# Patient Record
Sex: Female | Born: 1995 | Race: Black or African American | Hispanic: No | Marital: Single | Smoking: Never smoker
Health system: Southern US, Community
[De-identification: ages and names within clinical notes are randomized; demographics above are authoritative.]

## PROBLEM LIST (undated history)

## (undated) DIAGNOSIS — N390 Urinary tract infection, site not specified: Secondary | ICD-10-CM

---

## 2016-01-30 ENCOUNTER — Emergency Department (HOSPITAL_COMMUNITY)
Admission: EM | Admit: 2016-01-30 | Discharge: 2016-01-30 | Discharge status: Absent without leave | Payer: Self-pay | Source: Home / Self Care

## 2016-01-30 ENCOUNTER — Ambulatory Visit: Payer: Self-pay

## 2018-01-26 ENCOUNTER — Ambulatory Visit
Admission: EM | Admit: 2018-01-26 | Discharge: 2018-01-26 | Disposition: A | Discharge status: Home / Self Care | Payer: No Typology Code available for payment source | Attending: Family Medicine | Admitting: Family Medicine

## 2018-01-26 ENCOUNTER — Encounter: Payer: Self-pay | Admitting: Emergency Medicine

## 2018-01-26 ENCOUNTER — Other Ambulatory Visit: Payer: Self-pay

## 2018-01-26 DIAGNOSIS — R509 Fever, unspecified: Secondary | ICD-10-CM

## 2018-01-26 DIAGNOSIS — R69 Illness, unspecified: Secondary | ICD-10-CM | POA: Diagnosis not present

## 2018-01-26 DIAGNOSIS — R5383 Other fatigue: Secondary | ICD-10-CM | POA: Diagnosis not present

## 2018-01-26 DIAGNOSIS — J111 Influenza due to unidentified influenza virus with other respiratory manifestations: Secondary | ICD-10-CM

## 2018-01-26 DIAGNOSIS — J029 Acute pharyngitis, unspecified: Secondary | ICD-10-CM

## 2018-01-26 LAB — RAPID STREP SCREEN (MED CTR MEBANE ONLY): Streptococcus, Group A Screen (Direct): NEGATIVE

## 2018-01-26 MED ORDER — HYDROCOD POLST-CPM POLST ER 10-8 MG/5ML PO SUER: 5.0000 mL | Freq: Two times a day (BID) | ORAL | 0 refills | Status: DC | PRN

## 2018-01-26 NOTE — ED Provider Notes (Signed)
[facility]    CSN: [id number] Arrival date & time: 01/26/18  1623   History   Chief Complaint Chief Complaint  Patient presents with  . Cough  . Sore Throat  . Sinus Problem   HPI  22 year old female presents with the above complaints.  Patient states that she is been sick since Friday.  She states that she is concerned she has influenza.  She has had fever, chills, congestion, cough, weakness, sore throat, fatigue.  Last fever was on Sunday.  Her predominant complaint currently is cough.  Pain is reportedly 5/10 in severity.  No known exacerbating relieving factors.  No reported sick contacts.  No other associated symptoms.  No other complaints.  PMH - No medical problems.  Surgical Hx - No past surgeries.  OB History    No data available     Home Medications    Prior to Admission medications   Medication Sig Start Date End Date Taking? Authorizing Provider  chlorpheniramine-HYDROcodone (TUSSIONEX PENNKINETIC ER) 10-8 MG/5ML SUER Take 5 mLs by mouth every 12 (twelve) hours as needed. 01/26/18   Tommie Samsook, Maris Bena G, DO    Family History Refractive error Father    Refractive error Mother    Refractive error Sister     Social History Social History   Tobacco Use  . Smoking status: Never Smoker  . Smokeless tobacco: Never Used  Substance Use Topics  . Alcohol use: No    Frequency: Never  . Drug use: Not on file     Allergies   Patient has no known allergies.   Review of Systems Review of Systems  Constitutional: Positive for fatigue and fever.  HENT: Positive for congestion and sore throat.   Respiratory: Positive for cough.    Physical Exam Triage Vital Signs ED Triage Vitals  Enc Vitals Group     BP 01/26/18 1707 126/74     Pulse Rate 01/26/18 1707 90     Resp 01/26/18 1707 16     Temp 01/26/18 1707 98.3 F (36.8 C)     Temp Source 01/26/18 1707 Oral     SpO2 01/26/18 1707 100 %     Weight 01/26/18 1703 150 lb (68 kg)     Height  01/26/18 1703 5\' 6"  (1.676 m)     Head Circumference --      Peak Flow --      Pain Score 01/26/18 1703 5     Pain Loc --      Pain Edu? --      Excl. in GC? --    Updated Vital Signs BP 126/74 (BP Location: Right Arm)   Pulse 90   Temp 98.3 F (36.8 C) (Oral)   Resp 16   Ht 5\' 6"  (1.676 m)   Wt 150 lb (68 kg)   LMP 01/06/2018 (Exact Date)   SpO2 100%   BMI 24.21 kg/m   Physical Exam  Constitutional: She is oriented to person, place, and time. She appears well-developed. No distress.  HENT:  Mouth/Throat: Oropharynx is clear and moist.  Eyes: Conjunctivae are normal. Right eye exhibits no discharge. Left eye exhibits no discharge.  Cardiovascular: Normal rate and regular rhythm.  Pulmonary/Chest: Effort normal and breath sounds normal. She has no wheezes. She has no rales.  Neurological: She is alert and oriented to person, place, and time.  Psychiatric: She has a normal mood and affect. Her behavior is normal.  Nursing note and vitals reviewed.  UC Treatments / Results  Labs (all labs ordered are listed, but only abnormal results are displayed) Labs Reviewed  RAPID STREP SCREEN (NOT AT [facility])  CULTURE, GROUP A STREP [facility])    EKG  EKG Interpretation None       Radiology No results found.  Procedures Procedures (including critical care time)  Medications Ordered in UC Medications - No data to display   Initial Impression / Assessment and Plan / UC Course  I have reviewed the triage vital signs and the nursing notes.  Pertinent labs & imaging results that were available during my care of the patient were reviewed by me and considered in my medical decision making (see chart for details).     21 year old female presents with influenza-like illness.  Given the duration of her illness, influenza testing was not done.  No treatment regarding influenza as it will not be beneficial.  Tussionex for cough.  Supportive care.  Final Clinical Impressions(s) / UC  Diagnoses   Final diagnoses:  Influenza-like illness    ED Discharge Orders        Ordered    chlorpheniramine-HYDROcodone (TUSSIONEX PENNKINETIC ER) 10-8 MG/5ML SUER  Every 12 hours PRN     01/26/18 1736     Controlled Substance Prescriptions [place] Controlled Substance Registry consulted? Not Applicable   Tommie Sams, DO 01/26/18 1758

## 2018-01-26 NOTE — Discharge Instructions (Signed)
This is viral. Likely from recent influenza. No antibiotics needed at this time.  Rest, fluids.  Use the cough medication as prescribed.  I hope you feel better.  Take care  Dr. Adriana Simasook

## 2018-01-26 NOTE — ED Triage Notes (Signed)
Patient c/o sore throat, cough, congestion, and sinus pressure that started on Friday.

## 2018-01-29 LAB — CULTURE, GROUP A STREP (THRC)

## 2018-09-30 ENCOUNTER — Other Ambulatory Visit: Payer: Self-pay

## 2018-09-30 ENCOUNTER — Ambulatory Visit
Admission: EM | Admit: 2018-09-30 | Discharge: 2018-09-30 | Disposition: A | Discharge status: Home / Self Care | Payer: No Typology Code available for payment source | Attending: Family Medicine | Admitting: Family Medicine

## 2018-09-30 ENCOUNTER — Encounter: Payer: Self-pay | Admitting: Emergency Medicine

## 2018-09-30 DIAGNOSIS — R300 Dysuria: Secondary | ICD-10-CM

## 2018-09-30 LAB — URINALYSIS, COMPLETE (UACMP) WITH MICROSCOPIC
BILIRUBIN URINE: NEGATIVE
Glucose, UA: NEGATIVE mg/dL
NITRITE: NEGATIVE
SPECIFIC GRAVITY, URINE: 1.01 (ref 1.005–1.030)
pH: 7 (ref 5.0–8.0)

## 2018-09-30 MED ORDER — PHENAZOPYRIDINE HCL 100 MG PO TABS: 100.0000 mg | ORAL_TABLET | Freq: Three times a day (TID) | ORAL | 0 refills | Status: AC | PRN

## 2018-09-30 MED ORDER — CEPHALEXIN 500 MG PO CAPS: 500.0000 mg | ORAL_CAPSULE | Freq: Two times a day (BID) | ORAL | 0 refills | Status: AC

## 2018-09-30 NOTE — ED Triage Notes (Signed)
Patient was seen at another Urgent Care on 10/25 and treated for BV. She also c/o dysuria and frequency at that time. Patient stated at that time she did not have a UTI. On 10/29 she continued to have dysuria. Macrobid was sent in for the patient. Patient states her urine is still cloudy, she still has dysuria and now incontinence.

## 2018-09-30 NOTE — ED Provider Notes (Signed)
[facility]    CSN: [id number] Arrival date & time: 09/30/18  1810  History   Chief Complaint Chief Complaint  Patient presents with  . Dysuria  . Urinary Incontinence   HPI  22 year old female presents with urinary symptoms.  Patient has recently been seen by [facility] urgent [facility].  Patient was seen on 10/25.  Complained of vaginal discharge and odor and also dysuria.  Wet prep with BV.  Urinalysis not consistent with UTI.  Urine culture returned negative.  STD testing negative.  She was treated with Flagyl.  Patient reports that she has continued to have dysuria.  She called the office on 10/29 and was prescribed Macrobid.  She states that she has not tolerated it due to nausea and headache.  She reports continued dysuria.  Reports chills and urinary incontinence as well.  States that her urine is cloudy.  No known exacerbating relieving factors.  No other associated symptoms.  No other complaints.  Social hx reviewed as below. Social History Social History   Tobacco Use  . Smoking status: Never Smoker  . Smokeless tobacco: Never Used  Substance Use Topics  . Alcohol use: Yes    Frequency: Never    Comment: socially  . Drug use: Never    Allergies   Patient has no known allergies.  Review of Systems Review of Systems  Constitutional: Positive for chills.  Genitourinary: Positive for dysuria.   Physical Exam Triage Vital Signs ED Triage Vitals  Enc Vitals Group     BP 09/30/18 1824 113/84     Pulse Rate 09/30/18 1824 (!) 113     Resp 09/30/18 1824 18     Temp 09/30/18 1824 98 F (36.7 C)     Temp Source 09/30/18 1824 Oral     SpO2 09/30/18 1824 100 %     Weight 09/30/18 1825 153 lb (69.4 kg)     Height 09/30/18 1825 5\' 7"  (1.702 m)     Head Circumference --      Peak Flow --      Pain Score 09/30/18 1825 10     Pain Loc --      Pain Edu? --      Excl. in GC? --    Updated Vital Signs BP 113/84 (BP Location: Left Arm)   Pulse (!) 113   Temp 98  F (36.7 C) (Oral)   Resp 18   Ht 5\' 7"  (1.702 m)   Wt 69.4 kg   LMP 09/15/2018   SpO2 100%   BMI 23.96 kg/m   Visual Acuity Right Eye Distance:   Left Eye Distance:   Bilateral Distance:    Right Eye Near:   Left Eye Near:    Bilateral Near:     Physical Exam  Constitutional: She is oriented to person, place, and time. She appears well-developed. No distress.  Cardiovascular: Normal rate and regular rhythm.  Pulmonary/Chest: Effort normal and breath sounds normal.  Abdominal: Soft. She exhibits no distension. There is no tenderness.  Neurological: She is alert and oriented to person, place, and time.  Psychiatric: She has a normal mood and affect. Her behavior is normal.  Nursing note and vitals reviewed.  UC Treatments / Results  Labs (all labs ordered are listed, but only abnormal results are displayed) Labs Reviewed  URINALYSIS, COMPLETE (UACMP) WITH MICROSCOPIC - Abnormal; Notable for the following components:      Result Value   Hgb urine dipstick SMALL (*)    Ketones,  ur TRACE (*)    Protein, ur TRACE (*)    Leukocytes, UA TRACE (*)    Bacteria, UA FEW (*)    All other components within normal limits  URINE CULTURE    EKG None  Radiology No results found.  Procedures Procedures (including critical care time)  Medications Ordered in UC Medications - No data to display  Initial Impression / Assessment and Plan / UC Course  I have reviewed the triage vital signs and the nursing notes.  Pertinent labs & imaging results that were available during my care of the patient were reviewed by me and considered in my medical decision making (see chart for details).    22 year old female presents with dysuria.  Urine microscopy with red cells as well as white cells.  Sending culture.  Placing empirically on Keflex.  Stop Macrobid.  Final Clinical Impressions(s) / UC Diagnoses   Final diagnoses:  Dysuria     Discharge Instructions     Stop  Macrobid.  Start Keflex.  We will culture your urine.  Take care  Dr. Adriana Simas     ED Prescriptions    Medication Sig Dispense Auth. Provider   cephALEXin (KEFLEX) 500 MG capsule Take 1 capsule (500 mg total) by mouth 2 (two) times daily. 14 capsule Welles Walthall G, DO   phenazopyridine (PYRIDIUM) 100 MG tablet Take 1 tablet (100 mg total) by mouth 3 (three) times daily as needed for pain. 10 tablet Tommie Sams, DO     Controlled Substance Prescriptions [place] Controlled Substance Registry consulted? Not Applicable   Tommie Sams, DO 09/30/18 1610

## 2018-09-30 NOTE — Discharge Instructions (Signed)
Stop Macrobid.  Start Keflex.  We will culture your urine.  Take care  Dr. Adriana Simas

## 2018-10-02 LAB — URINE CULTURE

## 2018-10-04 ENCOUNTER — Telehealth (HOSPITAL_COMMUNITY): Payer: Self-pay

## 2018-10-04 NOTE — Telephone Encounter (Signed)
Urine culture positive for group a strep. Pt was treated with Keflex per Dr. Delton See.  Attempted to reach patient no answer at this time.

## 2020-04-16 ENCOUNTER — Emergency Department
Admission: EM | Admit: 2020-04-16 | Discharge: 2020-04-16 | Disposition: A | Discharge status: Home / Self Care | Payer: Medicaid Other | Attending: Emergency Medicine | Admitting: Emergency Medicine

## 2020-04-16 ENCOUNTER — Emergency Department: Payer: Medicaid Other

## 2020-04-16 ENCOUNTER — Other Ambulatory Visit: Payer: Self-pay

## 2020-04-16 DIAGNOSIS — M940 Chondrocostal junction syndrome [Tietze]: Secondary | ICD-10-CM

## 2020-04-16 DIAGNOSIS — R0789 Other chest pain: Secondary | ICD-10-CM

## 2020-04-16 LAB — CBC
HCT: 40 % (ref 36.0–46.0)
Hemoglobin: 12.8 g/dL (ref 12.0–15.0)
MCH: 23.2 pg — ABNORMAL LOW (ref 26.0–34.0)
MCHC: 32 g/dL (ref 30.0–36.0)
MCV: 72.6 fL — ABNORMAL LOW (ref 80.0–100.0)
Platelets: 302 10*3/uL (ref 150–400)
RBC: 5.51 MIL/uL — ABNORMAL HIGH (ref 3.87–5.11)
RDW: 14.5 % (ref 11.5–15.5)
WBC: 6.4 10*3/uL (ref 4.0–10.5)
nRBC: 0 % (ref 0.0–0.2)

## 2020-04-16 LAB — BASIC METABOLIC PANEL
Anion gap: 12 (ref 5–15)
BUN: 16 mg/dL (ref 6–20)
CO2: 19 mmol/L — ABNORMAL LOW (ref 22–32)
Calcium: 9.2 mg/dL (ref 8.9–10.3)
Chloride: 104 mmol/L (ref 98–111)
Creatinine, Ser: 1.09 mg/dL — ABNORMAL HIGH (ref 0.44–1.00)
GFR calc Af Amer: >60 mL/min (ref >60)
GFR calc non Af Amer: >60 mL/min (ref >60)
Glucose, Bld: 156 mg/dL — ABNORMAL HIGH (ref 70–99)
Potassium: 3.2 mmol/L — ABNORMAL LOW (ref 3.5–5.1)
Sodium: 135 mmol/L (ref 135–145)

## 2020-04-16 LAB — FIBRIN DERIVATIVES D-DIMER (ARMC ONLY): Fibrin derivatives D-dimer (ARMC): 267.08 ng/mL (FEU) (ref 0.00–499.00)

## 2020-04-16 LAB — TROPONIN I (HIGH SENSITIVITY)
Troponin I (High Sensitivity): 2 ng/L (ref <18)
Troponin I (High Sensitivity): 5 ng/L (ref <18)

## 2020-04-16 MED ORDER — OXYCODONE-ACETAMINOPHEN 5-325 MG PO TABS
1.0000 | ORAL_TABLET | Freq: Once | ORAL | Status: DC
  Filled 2020-04-16: qty 1

## 2020-04-16 MED ORDER — ONDANSETRON 4 MG PO TBDP
4.0000 mg | ORAL_TABLET | Freq: Once | ORAL | Status: AC
  Administered 2020-04-16: 4 mg via ORAL

## 2020-04-16 MED ORDER — NAPROXEN 500 MG PO TABS: 500.0000 mg | ORAL_TABLET | Freq: Two times a day (BID) | ORAL | 0 refills | Status: AC

## 2020-04-16 MED ORDER — OXYCODONE-ACETAMINOPHEN 5-325 MG PO TABS: 1.0000 | ORAL_TABLET | ORAL | 0 refills | Status: AC | PRN

## 2020-04-16 MED ORDER — ONDANSETRON 4 MG PO TBDP
ORAL_TABLET | ORAL | Status: AC
  Filled 2020-04-16: qty 1

## 2020-04-16 MED ORDER — KETOROLAC TROMETHAMINE 60 MG/2ML IM SOLN
30.0000 mg | Freq: Once | INTRAMUSCULAR | Status: AC
  Administered 2020-04-16: 30 mg via INTRAMUSCULAR
  Filled 2020-04-16: qty 2

## 2020-04-16 NOTE — ED Triage Notes (Signed)
Patient c/o left chest pain. Patient dx with pleurisy 2 days ago. Patient reports pain becoming more severe.

## 2020-04-16 NOTE — Discharge Instructions (Signed)
1.  Take pain medicines as prescribed (Naprosyn/Percocet #15). 2.  Apply moist heat to affected area several times daily. 3.  Return to the ER for worsening symptoms, persistent vomiting, difficulty breathing or other concerns.

## 2020-04-16 NOTE — ED Provider Notes (Signed)
[facility] Emergency Department Provider Note   ____________________________________________   First MD Initiated Contact with Patient 04/16/20 [phone]     (approximate)  I have reviewed the triage vital signs and the nursing notes.   HISTORY  Chief Complaint Chest Pain    HPI Courtney Macias is a 24 y.o. female who presents to the ED from home with a chief complaint of chest pain.  Patient reports left chest wall pain x1.5 weeks.  This is her third ED visit for this.  On 5/14 she left without being seen from [facility].  She had labs and Covid testing done there which were unremarkable.  The following day she was evaluated at [facility], had negative work-up including negative D-dimer.  Received IM Toradol and discharge.  Reports progressive left chest wall pain exacerbated by breathing and movement.  Denies fever, cough, shortness of breath, abdominal pain, nausea, vomiting, dizziness.  Denies recent travel, trauma or OCP use.       Past medical history None  There are no problems to display for this patient.   History reviewed. No pertinent surgical history.  Prior to Admission medications   Medication Sig Start Date End Date Taking? Authorizing Provider  cephALEXin (KEFLEX) 500 MG capsule Take 1 capsule (500 mg total) by mouth 2 (two) times daily. 09/30/18   Tommie Sams, DO  naproxen (NAPROSYN) 500 MG tablet Take 1 tablet (500 mg total) by mouth 2 (two) times daily with a meal. 04/16/20   Irean Hong, MD  oxyCODONE-acetaminophen (PERCOCET/ROXICET) 5-325 MG tablet Take 1 tablet by mouth every 4 (four) hours as needed for severe pain. 04/16/20   Irean Hong, MD  phenazopyridine (PYRIDIUM) 100 MG tablet Take 1 tablet (100 mg total) by mouth 3 (three) times daily as needed for pain. 09/30/18   Tommie Sams, DO    Allergies Patient has no known allergies.  No family history on file.  Social History Social History   Tobacco Use  . Smoking status: Never  Smoker  . Smokeless tobacco: Never Used  Substance Use Topics  . Alcohol use: Yes    Comment: socially  . Drug use: Never    Review of Systems  Constitutional: No fever/chills Eyes: No visual changes. ENT: No sore throat. Cardiovascular: Positive for left chest wall pain. Respiratory: Denies shortness of breath. Gastrointestinal: No abdominal pain.  No nausea, no vomiting.  No diarrhea.  No constipation. Genitourinary: Negative for dysuria. Musculoskeletal: Negative for back pain. Skin: Negative for rash. Neurological: Negative for headaches, focal weakness or numbness.   ____________________________________________   PHYSICAL EXAM:  VITAL SIGNS: ED Triage Vitals  Enc Vitals Group     BP 04/16/20 0017 (!) 152/81     Pulse Rate 04/16/20 0017 (!) 115     Resp 04/16/20 0017 18     Temp 04/16/20 0017 99.4 F (37.4 C)     Temp Source 04/16/20 0216 Oral     SpO2 04/16/20 0017 100 %     Weight 04/16/20 0015 170 lb (77.1 kg)     Height 04/16/20 0015 5\' 7"  (1.702 m)     Head Circumference --      Peak Flow --      Pain Score 04/16/20 0015 8     Pain Loc --      Pain Edu? --      Excl. in GC? --     Constitutional: Alert and oriented. Well appearing and in no acute distress.  Eyes: Conjunctivae are normal. PERRL. EOMI. Head: Atraumatic. Nose: No congestion/rhinnorhea. Mouth/Throat: Mucous membranes are moist.  Oropharynx non-erythematous. Neck: No stridor.   Cardiovascular: Normal rate, regular rhythm. Grossly normal heart sounds.  Good peripheral circulation. Respiratory: Normal respiratory effort.  No retractions. Lungs CTAB.  Tender to palpation to left anterior chest wall.  Pain on movement. Gastrointestinal: Soft and nontender. No distention. No abdominal bruits. No CVA tenderness. Musculoskeletal: No lower extremity tenderness nor edema.  No joint effusions. Neurologic:  Normal speech and language. No gross focal neurologic deficits are appreciated. No gait  instability. Skin:  Skin is warm, dry and intact. No rash noted. Psychiatric: Mood and affect are normal. Speech and behavior are normal.  ____________________________________________   LABS (all labs ordered are listed, but only abnormal results are displayed)  Labs Reviewed  BASIC METABOLIC PANEL - Abnormal; Notable for the following components:      Result Value   Potassium 3.2 (*)    CO2 19 (*)    Glucose, Bld 156 (*)    Creatinine, Ser 1.09 (*)    All other components within normal limits  CBC - Abnormal; Notable for the following components:   RBC 5.51 (*)    MCV 72.6 (*)    MCH 23.2 (*)    All other components within normal limits  FIBRIN DERIVATIVES D-DIMER ([facility] ONLY)  POC URINE PREG, ED  TROPONIN I (HIGH SENSITIVITY)  TROPONIN I (HIGH SENSITIVITY)   ____________________________________________  EKG  ED ECG REPORT I, Lamica Mccart J, the attending physician, personally viewed and interpreted this ECG.   Date: 04/16/2020  EKG Time: 0008  Rate: 115  Rhythm: sinus tachycardia  Axis: Normal  Intervals:none  ST&T Change: Nonspecific  ____________________________________________  RADIOLOGY  ED MD interpretation: No acute cardiopulmonary process  Official radiology report(s): DG Chest 2 View  Result Date: 04/16/2020 CLINICAL DATA:  Chest pain EXAM: CHEST - 2 VIEW COMPARISON:  None. FINDINGS: The heart size and mediastinal contours are within normal limits. Both lungs are clear. The visualized skeletal structures are unremarkable. IMPRESSION: No active cardiopulmonary disease. Electronically Signed   By: Prudencio Pair M.D.   On: 04/16/2020 00:42    ____________________________________________   PROCEDURES  Procedure(s) performed (including Critical Care):  Procedures   ____________________________________________   INITIAL IMPRESSION / ASSESSMENT AND PLAN / ED COURSE  As part of my medical decision making, I reviewed the following data within the  [place] notes reviewed and incorporated, Labs reviewed, EKG interpreted, Old chart reviewed, Radiograph reviewed and Notes from prior ED visits     Landis Martins was evaluated in Emergency Department on 04/16/2020 for the symptoms described in the history of present illness. She was evaluated in the context of the global COVID-19 pandemic, which necessitated consideration that the patient might be at risk for infection with the SARS-CoV-2 virus that causes COVID-19. Institutional protocols and algorithms that pertain to the evaluation of patients at risk for COVID-19 are in a state of rapid change based on information released by regulatory bodies including the CDC and federal and state organizations. These policies and algorithms were followed during the patient's care in the ED.    24 year old female who presents with left chest wall pain x1.5 weeks. Differential diagnosis includes, but is not limited to, ACS, aortic dissection, pulmonary embolism, cardiac tamponade, pneumothorax, pneumonia, pericarditis, myocarditis, GI-related causes including esophagitis/gastritis, and musculoskeletal chest wall pain.    Laboratory results including 2 sets of troponins and D-dimer are  unremarkable.  Chest x-ray unrevealing.  Left chest wall is tender to palpation.  Will administer IM Toradol, Percocet, encouraged moist heat.  Strict return precautions given.  Patient verbalizes understanding and agrees with plan of care.      ____________________________________________   FINAL CLINICAL IMPRESSION(S) / ED DIAGNOSES  Final diagnoses:  Chest wall pain  Costochondritis     ED Discharge Orders         Ordered    naproxen (NAPROSYN) 500 MG tablet  2 times daily with meals     04/16/20 0403    oxyCODONE-acetaminophen (PERCOCET/ROXICET) 5-325 MG tablet  Every 4 hours PRN     04/16/20 0403           Note:  This document was prepared using Dragon voice recognition software  and may include unintentional dictation errors.   Irean Hong, MD 04/16/20 [phone]

## 2020-04-16 NOTE — ED Notes (Signed)
Pt alert and oriented X 4, stable for discharge. RR even and unlabored, color WNL. Discussed discharge instructions and follow up when appropriate. Instructed to follow up with ER for any life threatening symptoms or concerns that patient or family of patient may have  

## 2020-08-22 ENCOUNTER — Other Ambulatory Visit: Payer: Self-pay

## 2020-08-22 ENCOUNTER — Ambulatory Visit
Admission: EM | Admit: 2020-08-22 | Discharge: 2020-08-22 | Disposition: A | Discharge status: Home / Self Care | Payer: Medicaid Other

## 2020-10-15 ENCOUNTER — Ambulatory Visit
Admission: EM | Admit: 2020-10-15 | Discharge: 2020-10-15 | Disposition: A | Discharge status: Home / Self Care | Payer: HRSA Program | Attending: Family Medicine | Admitting: Family Medicine

## 2020-10-15 ENCOUNTER — Other Ambulatory Visit: Payer: Self-pay

## 2020-10-15 DIAGNOSIS — R051 Acute cough: Secondary | ICD-10-CM | POA: Diagnosis present

## 2020-10-15 DIAGNOSIS — J069 Acute upper respiratory infection, unspecified: Secondary | ICD-10-CM | POA: Diagnosis not present

## 2020-10-15 DIAGNOSIS — Z20822 Contact with and (suspected) exposure to covid-19: Secondary | ICD-10-CM | POA: Insufficient documentation

## 2020-10-15 DIAGNOSIS — N390 Urinary tract infection, site not specified: Secondary | ICD-10-CM | POA: Insufficient documentation

## 2020-10-15 HISTORY — DX: Urinary tract infection, site not specified: N39.0

## 2020-10-15 MED ORDER — PROMETHAZINE-DM 6.25-15 MG/5ML PO SYRP: 5.0000 mL | ORAL_SOLUTION | Freq: Four times a day (QID) | ORAL | 0 refills | Status: AC | PRN

## 2020-10-15 MED ORDER — BENZONATATE 100 MG PO CAPS: 200.0000 mg | ORAL_CAPSULE | Freq: Three times a day (TID) | ORAL | 0 refills | Status: AC

## 2020-10-15 NOTE — Discharge Instructions (Addendum)
Rinse your sinuses twice daily with a [employer] sinus rinse kit and distilled water.  Use the Tessalon Perles during the day for cough.  Take them with a small sip of water.  Use the Promethazine DM at bedtime.  This will help dry up your congestion and help you with your cough.  It will also make you drowsy.  Isolate at home until your Covid swab is back.  If your Covid swab is positive you will have to quarantine for 10 days from the start of your symptoms.  After 10 days you can break quarantine if your symptoms have improved and you have not had a fever for 24 hours.  Return or follow-up with your primary care provider for new or worsening symptoms.

## 2020-10-15 NOTE — ED Provider Notes (Signed)
[facility]    CSN: [id number] Arrival date & time: 10/15/20  1624      History   Chief Complaint Chief Complaint  Patient presents with  . Cough    HPI Courtney Macias is a 24 y.o. female.   24 year old female here for evaluation of a cough.  Patient reports that she had a cough for the past 2 weeks.  This started when she went to [facility] and then reports that typically when she goes to [facility] she develops a sinus infection.  Patient states that she has had some chest tightness and postnasal drip.  She denies shortness of breath or wheezing, fever, sinus pain or pressure, ear pain or pressure, sore throat, or sick contacts.  Patient has not been vaccinated against Covid or the flu.     Past Medical History:  Diagnosis Date  . UTI (urinary tract infection)     Patient Active Problem List   Diagnosis Date Noted  . UTI (urinary tract infection)     History reviewed. No pertinent surgical history.  OB History   No obstetric history on file.      Home Medications    Prior to Admission medications   Medication Sig Start Date End Date Taking? Authorizing Provider  fluticasone (FLONASE) 50 MCG/ACT nasal spray Place 1 spray into both nostrils daily.   Yes [provider]  nitrofurantoin, macrocrystal-monohydrate, (MACROBID) 100 MG capsule Take 100 mg by mouth 2 (two) times daily. Started taking 10/11/20 for UTI   Yes [provider]  benzonatate (TESSALON) 100 MG capsule Take 2 capsules (200 mg total) by mouth every 8 (eight) hours. 10/15/20   Margarette Canada, NP  cephALEXin (KEFLEX) 500 MG capsule Take 1 capsule (500 mg total) by mouth 2 (two) times daily. 09/30/18   Coral Spikes, DO  naproxen (NAPROSYN) 500 MG tablet Take 1 tablet (500 mg total) by mouth 2 (two) times daily with a meal. 04/16/20   Paulette Blanch, MD  oxyCODONE-acetaminophen (PERCOCET/ROXICET) 5-325 MG tablet Take 1 tablet by mouth every 4 (four) hours as needed for severe  pain. 04/16/20   Paulette Blanch, MD  phenazopyridine (PYRIDIUM) 100 MG tablet Take 1 tablet (100 mg total) by mouth 3 (three) times daily as needed for pain. 09/30/18   Coral Spikes, DO  promethazine-dextromethorphan (PROMETHAZINE-DM) 6.25-15 MG/5ML syrup Take 5 mLs by mouth 4 (four) times daily as needed. 10/15/20   Margarette Canada, NP    Family History Family History  Problem Relation Age of Onset  . Healthy Mother   . Healthy Father     Social History Social History   Tobacco Use  . Smoking status: Never Smoker  . Smokeless tobacco: Never Used  Vaping Use  . Vaping Use: Never used  Substance Use Topics  . Alcohol use: Yes    Comment: socially  . Drug use: Never     Allergies   Patient has no known allergies.   Review of Systems Review of Systems  Constitutional: Negative for activity change, appetite change and fever.  HENT: Positive for postnasal drip and rhinorrhea. Negative for congestion, ear pain, sinus pressure, sinus pain, sore throat and tinnitus.   Respiratory: Positive for cough. Negative for shortness of breath and wheezing.   Cardiovascular: Negative for chest pain.  Gastrointestinal: Negative for diarrhea, nausea and vomiting.  Musculoskeletal: Negative for arthralgias and myalgias.  Skin: Negative for rash.  Neurological: Negative for syncope and headaches.  Hematological: Negative.  Psychiatric/Behavioral: Negative.      Physical Exam Triage Vital Signs ED Triage Vitals  Enc Vitals Group     BP 10/15/20 1801 (!) 130/95     Pulse Rate 10/15/20 1758 73     Resp 10/15/20 1758 18     Temp 10/15/20 1758 98.7 F (37.1 C)     Temp Source 10/15/20 1758 Oral     SpO2 10/15/20 1758 100 %     Weight 10/15/20 1754 172 lb (78 kg)     Height 10/15/20 1754 _0  (1.676 m)     Head Circumference --      Peak Flow --      Pain Score 10/15/20 1754 0     Pain Loc --      Pain Edu? --      Excl. in [place]? --    No data found.  Updated Vital Signs BP (!)  130/95   Pulse 73   Temp 98.7 F (37.1 C) (Oral)   Resp 18   Ht _1  (1.676 m)   Wt 172 lb (78 kg)   LMP 10/01/2020   SpO2 100%   BMI 27.76 kg/m   Visual Acuity Right Eye Distance:   Left Eye Distance:   Bilateral Distance:    Right Eye Near:   Left Eye Near:    Bilateral Near:     Physical Exam Vitals and nursing note reviewed.  Constitutional:      General: She is not in acute distress.    Appearance: Normal appearance. She is normal weight.  HENT:     Head: Normocephalic and atraumatic.     Right Ear: Tympanic membrane, ear canal and external ear normal.     Left Ear: Tympanic membrane, ear canal and external ear normal.     Nose: Congestion and rhinorrhea present.     Comments: Nasal mucosa is mildly edematous without erythema.  Scant clear nasal discharge present.    Mouth/Throat:     Mouth: Mucous membranes are moist.     Pharynx: Oropharynx is clear. No oropharyngeal exudate or posterior oropharyngeal erythema.     Comments: Mild clear postnasal drip present in posterior oropharynx.  No erythema or injection.  Patient also has a single solitary stone in the upper pole of the left tonsil.  No tonsillar erythema, edema, injection, or exudate present. Eyes:     General: No scleral icterus.    Extraocular Movements: Extraocular movements intact.     Conjunctiva/sclera: Conjunctivae normal.     Pupils: Pupils are equal, round, and reactive to light.  Cardiovascular:     Rate and Rhythm: Normal rate and regular rhythm.     Pulses: Normal pulses.     Heart sounds: Normal heart sounds. No murmur heard.  No gallop.   Pulmonary:     Effort: Pulmonary effort is normal.     Breath sounds: Normal breath sounds. No wheezing, rhonchi or rales.  Musculoskeletal:        General: No swelling or tenderness. Normal range of motion.     Cervical back: Normal range of motion and neck supple.  Lymphadenopathy:     Cervical: No cervical adenopathy.  Skin:    General: Skin is  warm and dry.     Capillary Refill: Capillary refill takes less than 2 seconds.     Findings: No erythema or rash.  Neurological:     General: No focal deficit present.     Mental Status: She is alert and  oriented to person, place, and time.  Psychiatric:        Mood and Affect: Mood normal.        Behavior: Behavior normal.        Thought Content: Thought content normal.        Judgment: Judgment normal.      UC Treatments / Results  Labs (all labs ordered are listed, but only abnormal results are displayed) Labs Reviewed  SARS CORONAVIRUS 2 (TAT 6-24 HRS)    EKG   Radiology No results found.  Procedures Procedures (including critical care time)  Medications Ordered in UC Medications - No data to display  Initial Impression / Assessment and Plan / UC Course  I have reviewed the triage vital signs and the nursing notes.  Pertinent labs & imaging results that were available during my care of the patient were reviewed by me and considered in my medical decision making (see chart for details).   Patient is here because she has had a cough for the past 2 weeks.  The cough is present during the day but is mostly present at night when she lays down.  Patient reports that she has had some postnasal drip and a little bit of a runny nose.  Patient states that she first developed symptoms when she returned from [facility].  Patient reports that she gets similar symptoms whenever she returns from [place].  She has been using a humidifier at home to help her symptoms without much relief.  Patient states that she is mainly here for cough relief.  Will check Covid swab.  Will DC home with diagnosis of URI and treat with benzonatate and Promethazine DM.   Final Clinical Impressions(s) / UC Diagnoses   Final diagnoses:  Viral URI with cough     Discharge Instructions     Rinse your sinuses twice daily with a [employer] sinus rinse kit and distilled water.  Use the Tessalon Perles  during the day for cough.  Take them with a small sip of water.  Use the Promethazine DM at bedtime.  This will help dry up your congestion and help you with your cough.  It will also make you drowsy.  Isolate at home until your Covid swab is back.  If your Covid swab is positive you will have to quarantine for 10 days from the start of your symptoms.  After 10 days you can break quarantine if your symptoms have improved and you have not had a fever for 24 hours.  Return or follow-up with your primary care provider for new or worsening symptoms.    ED Prescriptions    Medication Sig Dispense Auth. Provider   benzonatate (TESSALON) 100 MG capsule Take 2 capsules (200 mg total) by mouth every 8 (eight) hours. 21 capsule Margarette Canada, NP   promethazine-dextromethorphan (PROMETHAZINE-DM) 6.25-15 MG/5ML syrup Take 5 mLs by mouth 4 (four) times daily as needed. 118 mL Margarette Canada, NP     PDMP not reviewed this encounter.   Margarette Canada, NP 10/15/20 1826

## 2020-10-15 NOTE — ED Triage Notes (Addendum)
Pt presents to Urgent Care with c/o cough x approx 2 weeks. Pt also c/o intermittent "tightness" to L chest x 1 week. Denies sob, nausea, and radiating pain. No known COVID exposure; pt not vaccinated.

## 2020-10-16 LAB — SARS CORONAVIRUS 2 (TAT 6-24 HRS): SARS Coronavirus 2: NEGATIVE

## 2021-07-23 IMAGING — CR DG CHEST 2V
1 series · 2 of 2 positions shown · non-contrast
Comparison: None.

CLINICAL DATA: Chest pain

EXAM:
CHEST - 2 VIEW

[Series 1: dg chest 2 view · 0.14mm/px · 2 of 2 slices shown]
[im 1/2]
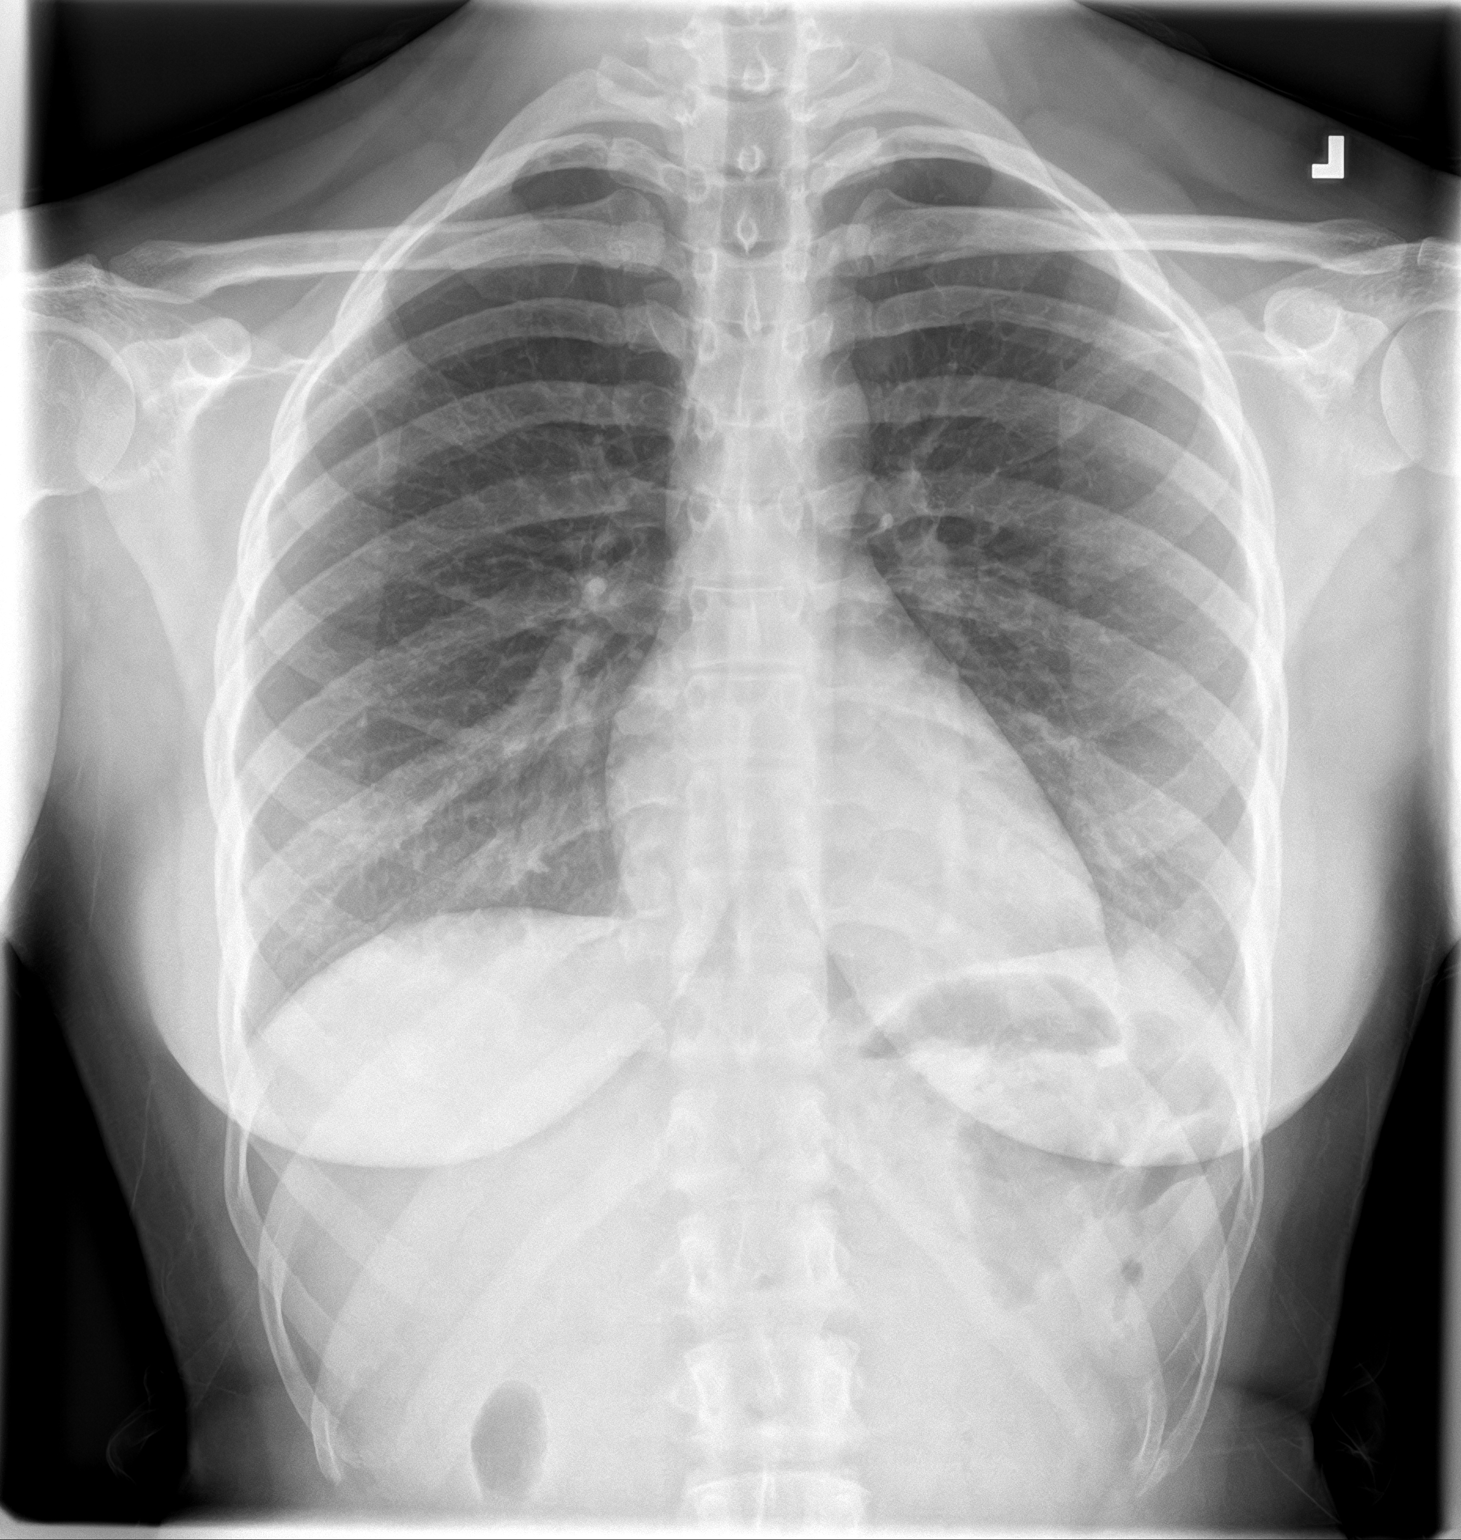
[im 2/2]
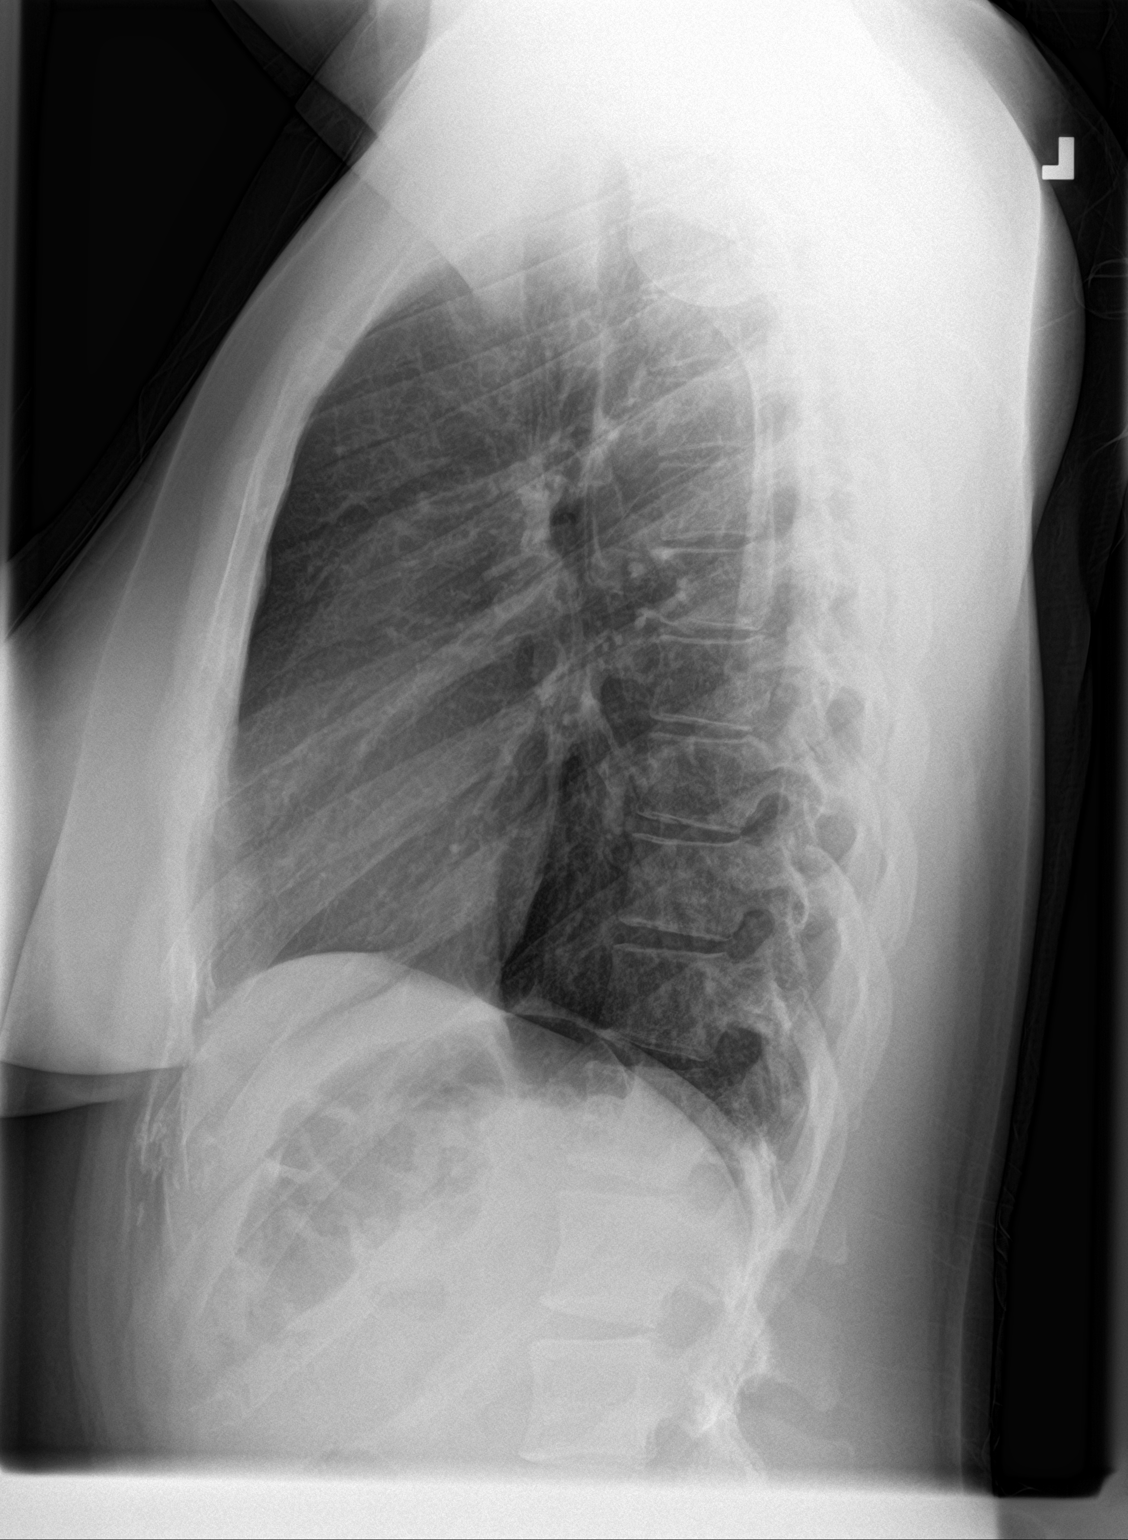

[2 of 2 positions shown; findings below may reference images not displayed]

FINDINGS: The heart size and mediastinal contours are within normal limits.
Both lungs are clear. The visualized skeletal structures are
unremarkable.
IMPRESSION: No active cardiopulmonary disease.
